# Patient Record
Sex: Male | Born: 2000 | Race: White | Hispanic: No | Marital: Single | Smoking: Never smoker
Health system: Southern US, Community
[De-identification: ages and names within clinical notes are randomized; demographics above are authoritative.]

## PROBLEM LIST (undated history)

## (undated) DIAGNOSIS — D6941 Evans syndrome: Secondary | ICD-10-CM

## (undated) HISTORY — PX: LYMPH NODE BIOPSY: SHX201

## (undated) HISTORY — PX: APPENDECTOMY: SHX54

---
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
## 2020-03-06 IMAGING — CR DG CHEST 2V
2 series · 2 of 2 positions shown · non-contrast
Comparison: None.

CLINICAL DATA: Palpitations

EXAM:
CHEST - 2 VIEW

[chest pa]
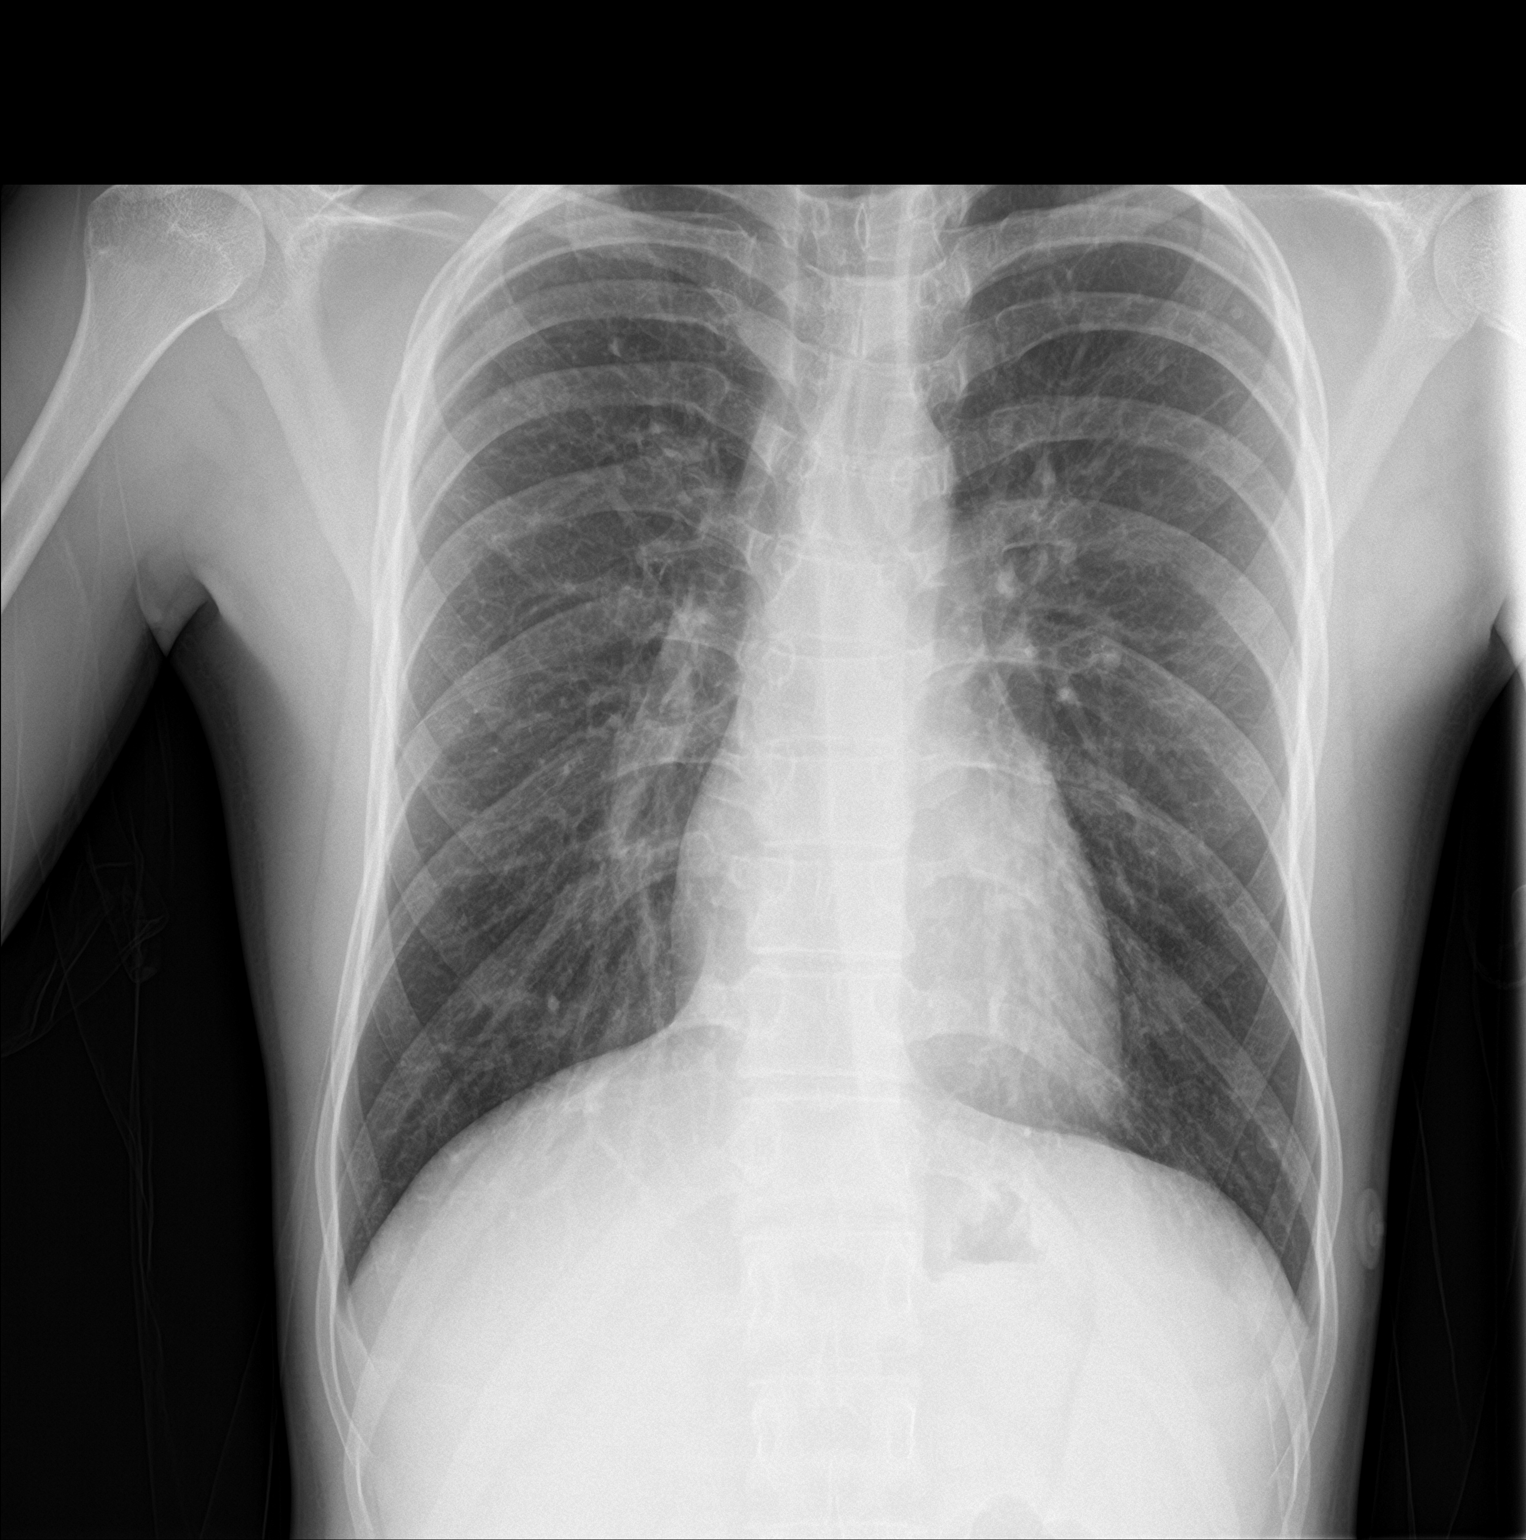

[chest lat]
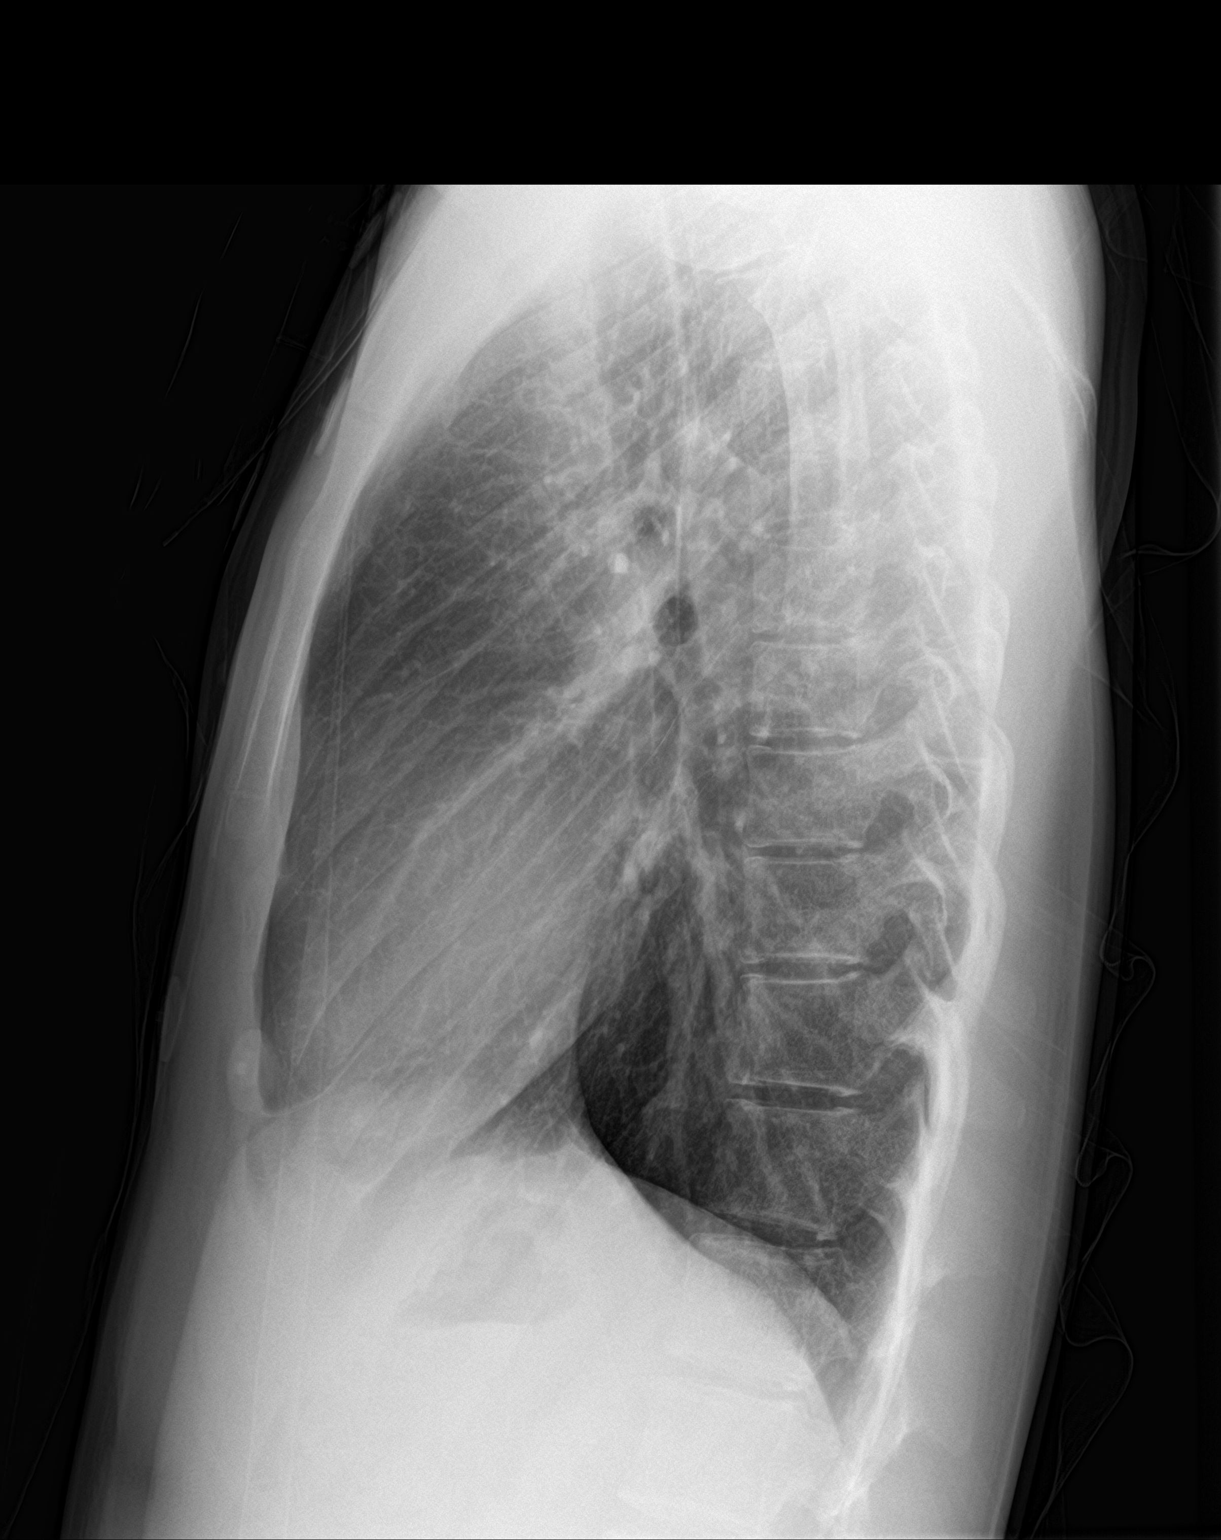

[2 of 2 positions shown; findings below may reference images not displayed]

FINDINGS: Hyperinflation of the lungs without an underlying parenchymal
process. No consolidation, features of edema, pneumothorax, or
effusion. Pulmonary vascularity is normally distributed. The
cardiomediastinal contours are unremarkable. No acute osseous or
soft tissue abnormality.
IMPRESSION: Lung hyperinflation, likely related to inspiratory effort.

No other acute cardiopulmonary process.
